# Patient Record
Sex: Male | Born: 1965 | Race: White | Hispanic: No | Marital: Single | Smoking: Current some day smoker
Health system: Southern US, Community
[De-identification: ages and names within clinical notes are randomized; demographics above are authoritative.]

## PROBLEM LIST (undated history)

## (undated) HISTORY — PX: FINGER SURGERY: SHX640

## (undated) HISTORY — PX: ANKLE FRACTURE SURGERY: SHX122

## (undated) HISTORY — PX: APPENDECTOMY: SHX54

---

## 1998-03-17 ENCOUNTER — Encounter (HOSPITAL_COMMUNITY): Admission: RE | Admit: 1998-03-17 | Discharge: 1998-06-15 | Payer: Self-pay | Admitting: Psychiatry

## 1999-01-21 ENCOUNTER — Emergency Department (HOSPITAL_COMMUNITY): Admission: EM | Admit: 1999-01-21 | Discharge: 1999-01-21 | Payer: Self-pay | Admitting: Emergency Medicine

## 2001-05-06 ENCOUNTER — Observation Stay (HOSPITAL_COMMUNITY): Admission: EM | Admit: 2001-05-06 | Discharge: 2001-05-08 | Payer: Self-pay | Admitting: *Deleted

## 2001-05-06 ENCOUNTER — Encounter: Payer: Self-pay | Admitting: *Deleted

## 2011-11-06 ENCOUNTER — Encounter: Payer: Self-pay | Admitting: *Deleted

## 2011-11-06 ENCOUNTER — Emergency Department (HOSPITAL_COMMUNITY): Payer: Self-pay

## 2011-11-06 ENCOUNTER — Emergency Department (HOSPITAL_COMMUNITY)
Admission: EM | Admit: 2011-11-06 | Discharge: 2011-11-06 | Disposition: A | Discharge status: Home / Self Care | Payer: Self-pay | Attending: Emergency Medicine | Admitting: Emergency Medicine

## 2011-11-06 DIAGNOSIS — R0781 Pleurodynia: Secondary | ICD-10-CM

## 2011-11-06 DIAGNOSIS — S298XXA Other specified injuries of thorax, initial encounter: Secondary | ICD-10-CM | POA: Insufficient documentation

## 2011-11-06 DIAGNOSIS — R079 Chest pain, unspecified: Secondary | ICD-10-CM | POA: Insufficient documentation

## 2011-11-06 MED ORDER — HYDROCODONE-ACETAMINOPHEN 5-500 MG PO TABS: 1.0000 | ORAL_TABLET | Freq: Four times a day (QID) | ORAL | Status: AC | PRN

## 2011-11-06 MED ORDER — HYDROCODONE-ACETAMINOPHEN 5-325 MG PO TABS
1.0000 | ORAL_TABLET | Freq: Once | ORAL | Status: AC
  Administered 2011-11-06: 1 via ORAL
  Filled 2011-11-06: qty 1

## 2011-11-06 MED ORDER — MORPHINE SULFATE 4 MG/ML IJ SOLN
4.0000 mg | Freq: Once | INTRAMUSCULAR | Status: AC
  Administered 2011-11-06: 4 mg via INTRAVENOUS
  Filled 2011-11-06 (×2): qty 1

## 2011-11-06 MED ORDER — MORPHINE SULFATE 4 MG/ML IJ SOLN
4.0000 mg | Freq: Once | INTRAMUSCULAR | Status: AC
  Administered 2011-11-06: 4 mg via INTRAVENOUS
  Filled 2011-11-06: qty 1
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
## 2011-11-07 NOTE — ED Provider Notes (Signed)
I saw and evaluated the patient, reviewed the resident's note and I agree with the findings and plan.   .Face to face Exam:  General:  Awake HEENT:  Atraumatic Resp:  Normal effort Abd:  Nondistended Neuro:No focal weakness Lymph: No adenopathy   Nelia Shi, MD 11/07/11 [phone]

## 2012-07-06 ENCOUNTER — Observation Stay (HOSPITAL_COMMUNITY)
Admission: EM | Admit: 2012-07-06 | Discharge: 2012-07-08 | Disposition: A | Discharge status: Home / Self Care | Payer: Self-pay | Attending: Internal Medicine | Admitting: Internal Medicine

## 2012-07-06 ENCOUNTER — Emergency Department (HOSPITAL_COMMUNITY): Payer: Self-pay

## 2012-07-06 ENCOUNTER — Encounter (HOSPITAL_COMMUNITY): Payer: Self-pay | Admitting: Emergency Medicine

## 2012-07-06 DIAGNOSIS — R918 Other nonspecific abnormal finding of lung field: Secondary | ICD-10-CM

## 2012-07-06 DIAGNOSIS — M545 Low back pain, unspecified: Secondary | ICD-10-CM | POA: Insufficient documentation

## 2012-07-06 DIAGNOSIS — R0789 Other chest pain: Secondary | ICD-10-CM | POA: Insufficient documentation

## 2012-07-06 DIAGNOSIS — S32009A Unspecified fracture of unspecified lumbar vertebra, initial encounter for closed fracture: Principal | ICD-10-CM | POA: Insufficient documentation

## 2012-07-06 DIAGNOSIS — Z720 Tobacco use: Secondary | ICD-10-CM

## 2012-07-06 DIAGNOSIS — S1093XA Contusion of unspecified part of neck, initial encounter: Secondary | ICD-10-CM | POA: Insufficient documentation

## 2012-07-06 DIAGNOSIS — IMO0002 Reserved for concepts with insufficient information to code with codable children: Secondary | ICD-10-CM

## 2012-07-06 DIAGNOSIS — G8929 Other chronic pain: Secondary | ICD-10-CM

## 2012-07-06 DIAGNOSIS — M542 Cervicalgia: Secondary | ICD-10-CM | POA: Insufficient documentation

## 2012-07-06 DIAGNOSIS — M4850XA Collapsed vertebra, not elsewhere classified, site unspecified, initial encounter for fracture: Secondary | ICD-10-CM

## 2012-07-06 DIAGNOSIS — R109 Unspecified abdominal pain: Secondary | ICD-10-CM | POA: Insufficient documentation

## 2012-07-06 DIAGNOSIS — M546 Pain in thoracic spine: Secondary | ICD-10-CM | POA: Insufficient documentation

## 2012-07-06 DIAGNOSIS — F17200 Nicotine dependence, unspecified, uncomplicated: Secondary | ICD-10-CM | POA: Insufficient documentation

## 2012-07-06 DIAGNOSIS — R51 Headache: Secondary | ICD-10-CM | POA: Insufficient documentation

## 2012-07-06 DIAGNOSIS — R52 Pain, unspecified: Secondary | ICD-10-CM

## 2012-07-06 DIAGNOSIS — S0003XA Contusion of scalp, initial encounter: Secondary | ICD-10-CM | POA: Insufficient documentation

## 2012-07-06 DIAGNOSIS — R911 Solitary pulmonary nodule: Secondary | ICD-10-CM | POA: Insufficient documentation

## 2012-07-06 LAB — URINALYSIS, ROUTINE W REFLEX MICROSCOPIC
Glucose, UA: NEGATIVE mg/dL
Protein, ur: NEGATIVE mg/dL
Specific Gravity, Urine: <1.005 — ABNORMAL LOW (ref 1.005–1.030)

## 2012-07-06 LAB — CBC WITH DIFFERENTIAL/PLATELET
Eosinophils Relative: 0 % (ref 0–5)
HCT: 44.6 % (ref 39.0–52.0)
Hemoglobin: 15.4 g/dL (ref 13.0–17.0)
Lymphocytes Relative: 10 % — ABNORMAL LOW (ref 12–46)
Lymphs Abs: 1.1 10*3/uL (ref 0.7–4.0)
MCV: 99.3 fL (ref 78.0–100.0)
Monocytes Relative: 9 % (ref 3–12)
Platelets: 180 10*3/uL (ref 150–400)
RBC: 4.49 MIL/uL (ref 4.22–5.81)
WBC: 11.1 10*3/uL — ABNORMAL HIGH (ref 4.0–10.5)

## 2012-07-06 LAB — SAMPLE TO BLOOD BANK

## 2012-07-06 LAB — BASIC METABOLIC PANEL
CO2: 28 mEq/L (ref 19–32)
Calcium: 9 mg/dL (ref 8.4–10.5)
Glucose, Bld: 103 mg/dL — ABNORMAL HIGH (ref 70–99)
Sodium: 132 mEq/L — ABNORMAL LOW (ref 135–145)

## 2012-07-06 LAB — URINE MICROSCOPIC-ADD ON

## 2012-07-06 MED ORDER — IOHEXOL 300 MG/ML  SOLN
100.0000 mL | Freq: Once | INTRAMUSCULAR | Status: AC | PRN
  Administered 2012-07-06: 100 mL via INTRAVENOUS

## 2012-07-06 MED ORDER — HYDROMORPHONE HCL PF 2 MG/ML IJ SOLN
2.0000 mg | Freq: Once | INTRAMUSCULAR | Status: AC
  Administered 2012-07-06: 2 mg via INTRAVENOUS
  Filled 2012-07-06: qty 1

## 2012-07-06 MED ORDER — SODIUM CHLORIDE 0.9 % IV SOLN
INTRAVENOUS | Status: DC
  Administered 2012-07-06: 14:00:00 via INTRAVENOUS

## 2012-07-06 MED ORDER — ONDANSETRON HCL 4 MG/2ML IJ SOLN
4.0000 mg | Freq: Once | INTRAMUSCULAR | Status: AC
  Administered 2012-07-06: 4 mg via INTRAVENOUS
  Filled 2012-07-06: qty 2

## 2012-07-06 NOTE — ED Notes (Signed)
MD at bedside. 
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
## 2012-07-06 NOTE — ED Notes (Signed)
Patient was placed in c-collar and given ice pack in Triage.
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
## 2012-07-07 ENCOUNTER — Encounter (HOSPITAL_COMMUNITY): Payer: Self-pay | Admitting: Orthopedic Surgery

## 2012-07-07 DIAGNOSIS — R52 Pain, unspecified: Secondary | ICD-10-CM | POA: Diagnosis present

## 2012-07-07 DIAGNOSIS — Z720 Tobacco use: Secondary | ICD-10-CM | POA: Diagnosis present

## 2012-07-07 DIAGNOSIS — M4850XA Collapsed vertebra, not elsewhere classified, site unspecified, initial encounter for fracture: Secondary | ICD-10-CM | POA: Diagnosis present

## 2012-07-07 DIAGNOSIS — R918 Other nonspecific abnormal finding of lung field: Secondary | ICD-10-CM | POA: Diagnosis present

## 2012-07-07 DIAGNOSIS — S32009A Unspecified fracture of unspecified lumbar vertebra, initial encounter for closed fracture: Principal | ICD-10-CM

## 2012-07-07 LAB — CBC WITH DIFFERENTIAL/PLATELET
Basophils Absolute: 0 10*3/uL (ref 0.0–0.1)
Basophils Relative: 0 % (ref 0–1)
Eosinophils Relative: 1 % (ref 0–5)
HCT: 42.7 % (ref 39.0–52.0)
MCH: 34 pg (ref 26.0–34.0)
MCHC: 34 g/dL (ref 30.0–36.0)
MCV: 100 fL (ref 78.0–100.0)
Monocytes Absolute: 0.9 10*3/uL (ref 0.1–1.0)
RDW: 13.4 % (ref 11.5–15.5)

## 2012-07-07 LAB — COMPREHENSIVE METABOLIC PANEL
AST: 21 U/L (ref 0–37)
Albumin: 3.1 g/dL — ABNORMAL LOW (ref 3.5–5.2)
Calcium: 8.4 mg/dL (ref 8.4–10.5)
Creatinine, Ser: 0.97 mg/dL (ref 0.50–1.35)
GFR calc non Af Amer: >90 mL/min (ref >90)

## 2012-07-07 LAB — CARDIAC PANEL(CRET KIN+CKTOT+MB+TROPI): Total CK: 347 U/L — ABNORMAL HIGH (ref 7–232)

## 2012-07-07 LAB — CK TOTAL AND CKMB (NOT AT ARMC)
CK, MB: 2.4 ng/mL (ref 0.3–4.0)
Relative Index: 1 (ref 0.0–2.5)
Total CK: 241 U/L — ABNORMAL HIGH (ref 7–232)

## 2012-07-07 MED ORDER — OXYCODONE-ACETAMINOPHEN 5-325 MG PO TABS
1.0000 | ORAL_TABLET | ORAL | Status: DC
  Administered 2012-07-07 – 2012-07-08 (×7): 1 via ORAL
  Filled 2012-07-07 (×7): qty 1

## 2012-07-07 MED ORDER — MORPHINE SULFATE 4 MG/ML IJ SOLN
4.0000 mg | INTRAMUSCULAR | Status: DC | PRN
  Administered 2012-07-07 – 2012-07-08 (×8): 4 mg via INTRAVENOUS
  Filled 2012-07-07 (×8): qty 1

## 2012-07-07 MED ORDER — ENOXAPARIN SODIUM 40 MG/0.4ML ~~LOC~~ SOLN
40.0000 mg | SUBCUTANEOUS | Status: DC
  Administered 2012-07-07 – 2012-07-08 (×2): 40 mg via SUBCUTANEOUS
  Filled 2012-07-07 (×2): qty 0.4

## 2012-07-07 MED ORDER — MAGNESIUM HYDROXIDE NICU ORAL SYRINGE 400 MG/5 ML: 30.0000 mL | Freq: Every day | ORAL | Status: DC

## 2012-07-07 MED ORDER — DOCUSATE SODIUM 100 MG PO CAPS
100.0000 mg | ORAL_CAPSULE | Freq: Two times a day (BID) | ORAL | Status: DC
  Administered 2012-07-07 – 2012-07-08 (×3): 100 mg via ORAL
  Filled 2012-07-07 (×3): qty 1

## 2012-07-07 MED ORDER — NICOTINE 14 MG/24HR TD PT24
14.0000 mg | MEDICATED_PATCH | Freq: Every day | TRANSDERMAL | Status: DC
  Administered 2012-07-07 – 2012-07-08 (×2): 14 mg via TRANSDERMAL
  Filled 2012-07-07 (×2): qty 1

## 2012-07-07 MED ORDER — IBUPROFEN 800 MG PO TABS
800.0000 mg | ORAL_TABLET | Freq: Three times a day (TID) | ORAL | Status: DC
  Administered 2012-07-07 – 2012-07-08 (×4): 800 mg via ORAL
  Filled 2012-07-07 (×4): qty 1

## 2012-07-07 MED ORDER — OXYCODONE-ACETAMINOPHEN 5-325 MG PO TABS
2.0000 | ORAL_TABLET | Freq: Four times a day (QID) | ORAL | Status: DC
  Administered 2012-07-07: 2 via ORAL

## 2012-07-07 MED ORDER — ONDANSETRON HCL 4 MG/2ML IJ SOLN: 4.0000 mg | Freq: Four times a day (QID) | INTRAMUSCULAR | Status: DC | PRN

## 2012-07-07 MED ORDER — DIPHENHYDRAMINE HCL 25 MG PO CAPS
25.0000 mg | ORAL_CAPSULE | Freq: Four times a day (QID) | ORAL | Status: DC | PRN
  Administered 2012-07-07 – 2012-07-08 (×3): 25 mg via ORAL
  Filled 2012-07-07 (×3): qty 1

## 2012-07-07 MED ORDER — ACETAMINOPHEN 325 MG PO TABS: 650.0000 mg | ORAL_TABLET | ORAL | Status: DC | PRN

## 2012-07-07 MED ORDER — MAGNESIUM HYDROXIDE 400 MG/5ML PO SUSP
30.0000 mL | Freq: Every day | ORAL | Status: DC
  Administered 2012-07-07 – 2012-07-08 (×2): 30 mL via ORAL
  Filled 2012-07-07 (×2): qty 30

## 2012-07-07 MED ORDER — ONDANSETRON HCL 4 MG PO TABS: 4.0000 mg | ORAL_TABLET | Freq: Four times a day (QID) | ORAL | Status: DC | PRN

## 2012-07-07 MED ORDER — MORPHINE SULFATE 2 MG/ML IJ SOLN: 2.0000 mg | INTRAMUSCULAR | Status: DC | PRN

## 2012-07-07 MED ORDER — METHOCARBAMOL 500 MG PO TABS
500.0000 mg | ORAL_TABLET | Freq: Four times a day (QID) | ORAL | Status: DC
  Administered 2012-07-07 – 2012-07-08 (×7): 500 mg via ORAL
  Filled 2012-07-07 (×6): qty 1

## 2012-07-07 MED ORDER — POTASSIUM CHLORIDE IN NACL 20-0.9 MEQ/L-% IV SOLN
INTRAVENOUS | Status: DC
  Administered 2012-07-07: 10:00:00 via INTRAVENOUS

## 2012-07-07 MED FILL — Methocarbamol Tab 500 MG: ORAL | Qty: 1 | Status: AC

## 2012-07-07 MED FILL — Nicotine TD Patch 24HR 14 MG/24HR: TRANSDERMAL | Qty: 1 | Status: AC

## 2012-07-07 MED FILL — Morphine Sulfate Inj 2 MG/ML: INTRAMUSCULAR | Qty: 1 | Status: AC

## 2012-07-07 MED FILL — Oxycodone w/ Acetaminophen Tab 5-325 MG: ORAL | Qty: 1 | Status: AC
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
## 2012-07-07 NOTE — Progress Notes (Signed)
Pt c/o "bug bites" that itch with morphine administration. Dr. Kerry Hough made aware and new order for benadryl ordered. Will administer per MD order.

## 2012-07-07 NOTE — Progress Notes (Signed)
Occupational Therapy Screen  OT order received. Chart reviewed. Spoke with PT about patient's functioning level during PT eval. Patient is ambulating independently with assistance. Went in and spoke with patient regarding OT and its role. Patient did not voice any concerns for discharge. 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 Encouraged him to look into agency's that may be able to provide some assistance. Patient educated and given handout regarding back precautions and tips and recommendations to utilize at home to prevent further pain and injury. OT will sign off.

## 2012-07-08 MED ORDER — OXYCODONE-ACETAMINOPHEN 5-325 MG PO TABS: 1.0000 | ORAL_TABLET | ORAL | Status: AC

## 2012-07-08 MED ORDER — METHOCARBAMOL 500 MG PO TABS: 500.0000 mg | ORAL_TABLET | Freq: Four times a day (QID) | ORAL | Status: AC

## 2012-07-08 MED ORDER — IBUPROFEN 800 MG PO TABS: 800.0000 mg | ORAL_TABLET | Freq: Three times a day (TID) | ORAL | Status: AC

## 2012-07-08 MED ORDER — DSS 100 MG PO CAPS: 100.0000 mg | ORAL_CAPSULE | Freq: Two times a day (BID) | ORAL | Status: AC

## 2012-07-08 NOTE — Progress Notes (Signed)
Discharge instructions reviewed with patient, patient voiced understanding. Patient given discharge instructions and prescriptions. Patient stated he was not able to get his prescriptions filled due to having no money. Asked case manager who stated there are no funds available to assist patient. Patient informed and patient stated he could afford it if it's not around $50.00. Patient in stable condition and waiting for his ride to arrive. Call light in reach. Will continue to monitor.

## 2012-07-08 NOTE — Progress Notes (Signed)
UR Chart Review Completed  
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
## 2012-07-08 NOTE — Progress Notes (Signed)
Patient in stable and transported out by this RN to ED. Patient left with Clista Bernhardt RN to get taxi ride set up.
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
## 2017-07-18 ENCOUNTER — Ambulatory Visit (INDEPENDENT_AMBULATORY_CARE_PROVIDER_SITE_OTHER): Payer: Managed Care, Other (non HMO)

## 2017-07-18 ENCOUNTER — Ambulatory Visit
Admission: EM | Admit: 2017-07-18 | Discharge: 2017-07-18 | Disposition: A | Discharge status: Home / Self Care | Payer: Managed Care, Other (non HMO) | Attending: Family Medicine | Admitting: Family Medicine

## 2017-07-18 ENCOUNTER — Encounter: Payer: Self-pay | Admitting: *Deleted

## 2017-07-18 DIAGNOSIS — S9031XA Contusion of right foot, initial encounter: Secondary | ICD-10-CM

## 2017-07-18 DIAGNOSIS — S90121A Contusion of right lesser toe(s) without damage to nail, initial encounter: Secondary | ICD-10-CM | POA: Diagnosis not present

## 2017-07-18 DIAGNOSIS — M79671 Pain in right foot: Secondary | ICD-10-CM | POA: Diagnosis not present

## 2017-07-18 MED ORDER — NAPROXEN 500 MG PO TABS: 500.0000 mg | ORAL_TABLET | Freq: Two times a day (BID) | ORAL | 0 refills | Status: DC | PRN

## 2017-07-18 NOTE — Discharge Instructions (Signed)
Recommend start Naproxen 500mg  twice a day as directed. Keep foot elevated as much as possible. Use ace wrap for support as needed. If pain and swelling do not improve within 3 to 4 days, call the Podiatrist listed for further evaluation.
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
## 2017-07-18 NOTE — ED Triage Notes (Signed)
Pt slipped and fell Sunday, struck right foot on brick wall. Now c/o right foot pain, edema and discoloration. Difficulty bearing weight.

## 2017-07-18 NOTE — ED Provider Notes (Signed)
CSN: [id number]     Arrival date & time 07/18/17  0806 History   First MD Initiated Contact with Patient 07/18/17 [phone]     Chief Complaint  Patient presents with  . Foot Pain   (Consider location/radiation/quality/duration/timing/severity/associated sxs/prior Treatment) 51 year old male presents with right foot pain and injury that occurred 4 days ago. He hit his right foot below his toes against a brick wall. Felt immediate pain but now having more pain and swelling in his toes and top of foot. Also having bruising on medial aspect of arch of foot and at base of toes. Has very limited range of motion. No pain in ankle but has history of fracture and 4 screws in ankle so normally has limited ROM. Has been applying ice and elevating foot with minimal relief. Did not work for 2 days then worked yesterday and was standing on his foot a lot which may have caused increased pain. Has also taken Ibuprofen with minimal relief. No other chronic health issues. Takes no daily medication.    The history is provided by the patient.    History reviewed. No pertinent past medical history. Past Surgical History:  Procedure Laterality Date  . ANKLE FRACTURE SURGERY     2009/OTIF  . APPENDECTOMY    . FINGER SURGERY Right    History reviewed. No pertinent family history. Social History  Substance Use Topics  . Smoking status: Former Games developermoker  . Smokeless tobacco: Current User  . Alcohol use Yes    Review of Systems  Constitutional: Negative for chills, fatigue and fever.  Respiratory: Negative for chest tightness, shortness of breath and wheezing.   Cardiovascular: Negative for chest pain and leg swelling.  Gastrointestinal: Negative for nausea and vomiting.  Musculoskeletal: Positive for arthralgias, gait problem, joint swelling and myalgias. Negative for back pain and neck pain.  Skin: Positive for color change. Negative for rash and wound.  Allergic/Immunologic: Negative for immunocompromised  state.  Neurological: Negative for dizziness, tremors, seizures, syncope, weakness, numbness and headaches.  Hematological: Negative for adenopathy. Does not bruise/bleed easily.    Allergies  Patient has no known allergies.  Home Medications   Prior to Admission medications   Medication Sig Start Date End Date Taking? Authorizing Provider  naproxen (NAPROSYN) 500 MG tablet Take 1 tablet (500 mg total) by mouth 2 (two) times daily as needed for moderate pain. 07/18/17   Sudie GrumblingAmyot, Lawson Mahone Berry, NP   Meds Ordered and Administered this Visit  Medications - No data to display  BP (!) 141/87 (BP Location: Left Arm)   Pulse 72   Temp 98.2 F (36.8 C) (Oral)   Resp 16   Ht 6\' 2"  (1.88 m)   Wt 200 lb (90.7 kg)   SpO2 99%   BMI 25.68 kg/m  No data found.   Physical Exam  Constitutional: He is oriented to person, place, and time. He appears well-developed and well-nourished. No distress.  HENT:  Head: Normocephalic and atraumatic.  Eyes: Conjunctivae and EOM are normal.  Neck: Normal range of motion.  Cardiovascular: Normal rate and regular rhythm.   Pulmonary/Chest: Effort normal and breath sounds normal. No respiratory distress.  Musculoskeletal: He exhibits edema and tenderness.       Right foot: There is decreased range of motion, tenderness, bony tenderness and swelling. There is normal capillary refill and no laceration.       Feet:  Swelling and ecchymosis present at base of all toes, especially 4th and 5th toes. Very tender  along mid-dorsal aspect of foot. Bruising present along medial plantar area of foot. Has decreased range of motion of toes and foot. Good capillary refill and pulses. No neuro deficit present.   Neurological: He is alert and oriented to person, place, and time. He has normal strength. No sensory deficit.  Skin: Skin is warm and dry. Capillary refill takes less than 2 seconds. No rash noted.  Psychiatric: He has a normal mood and affect. His behavior is normal.  Judgment and thought content normal.    Urgent Care Course     Procedures (including critical care time)  Labs Review Labs Reviewed - No data to display  Imaging Review Dg Foot Complete Right  Result Date: 07/18/2017 CLINICAL DATA:  Trauma with right foot pain. EXAM: RIGHT FOOT COMPLETE - 3+ VIEW COMPARISON:  None. FINDINGS: No acute fracture or malalignment. Joint spaces are preserved. Prior medial malleolar fracture fixation. IMPRESSION: Negative. Electronically Signed   By: Obie DredgeWilliam T Derry M.D.   On: 07/18/2017 09:09     Visual Acuity Review  Right Eye Distance:   Left Eye Distance:   Bilateral Distance:    Right Eye Near:   Left Eye Near:    Bilateral Near:         MDM   1. Acute foot pain, right   2. Contusion of right foot including toes, initial encounter    Reviewed negative x-ray results with patient. Encouraged to keep foot elevated as much as possible. Start Naproxen 500mg  twice a day as directed for pain and swelling. May use ace wrap for support. Note written for work with restrictions. Recommend follow-up with a Podiatrist in 3 to 4 days if pain and swelling do not improve.     Sudie GrumblingAmyot, Sawyer Kahan Berry, NP 07/18/17 2134

## 2017-11-29 ENCOUNTER — Other Ambulatory Visit: Payer: Self-pay | Admitting: Internal Medicine

## 2017-11-29 ENCOUNTER — Other Ambulatory Visit (HOSPITAL_COMMUNITY): Payer: Self-pay | Admitting: Occupational Therapy

## 2017-11-29 DIAGNOSIS — R10811 Right upper quadrant abdominal tenderness: Secondary | ICD-10-CM

## 2017-11-29 DIAGNOSIS — B182 Chronic viral hepatitis C: Secondary | ICD-10-CM

## 2017-12-03 ENCOUNTER — Ambulatory Visit
Admission: RE | Admit: 2017-12-03 | Discharge: 2017-12-03 | Disposition: A | Discharge status: Home / Self Care | Payer: Managed Care, Other (non HMO) | Source: Ambulatory Visit | Attending: Internal Medicine | Admitting: Internal Medicine

## 2017-12-03 DIAGNOSIS — R10811 Right upper quadrant abdominal tenderness: Secondary | ICD-10-CM | POA: Diagnosis not present

## 2017-12-03 DIAGNOSIS — K824 Cholesterolosis of gallbladder: Secondary | ICD-10-CM | POA: Diagnosis not present

## 2017-12-03 DIAGNOSIS — B182 Chronic viral hepatitis C: Secondary | ICD-10-CM | POA: Insufficient documentation

## 2018-12-05 ENCOUNTER — Encounter: Payer: Self-pay | Admitting: Physician Assistant

## 2018-12-05 ENCOUNTER — Ambulatory Visit: Payer: Self-pay | Admitting: Physician Assistant

## 2018-12-05 VITALS — BP 130/90 | HR 95 | Temp 98.5°F | Ht 74.0 in | Wt 235.0 lb

## 2018-12-05 DIAGNOSIS — J40 Bronchitis, not specified as acute or chronic: Secondary | ICD-10-CM

## 2018-12-05 DIAGNOSIS — R6889 Other general symptoms and signs: Secondary | ICD-10-CM

## 2018-12-05 LAB — POCT INFLUENZA A/B
Influenza A, POC: NEGATIVE
Influenza B, POC: NEGATIVE

## 2018-12-05 MED ORDER — ALBUTEROL SULFATE HFA 108 (90 BASE) MCG/ACT IN AERS: 2.0000 | INHALATION_SPRAY | RESPIRATORY_TRACT | 0 refills | Status: AC | PRN

## 2018-12-05 MED ORDER — PSEUDOEPH-BROMPHEN-DM 30-2-10 MG/5ML PO SYRP: 5.0000 mL | ORAL_SOLUTION | Freq: Four times a day (QID) | ORAL | 0 refills | Status: AC | PRN

## 2018-12-05 MED ORDER — PREDNISONE 50 MG PO TABS: 50.0000 mg | ORAL_TABLET | Freq: Every day | ORAL | 0 refills | Status: AC

## 2018-12-05 NOTE — Patient Instructions (Signed)
Thank you for choosing [facility] for your health care needs.  You have been diagnosed with bronchitis (a chest cold).  Recommend increase fluids; water, Gatorade, hot tea with lemon/honey, or orange juice. Rest. Go to bed early.  Use several pillows at night to prop yourself up, will help with coughing. Use cool mist humidifier in bedroom.  You have been prescribed prednisone, a steroid. Will decrease inflammation in the bronchial tubes, will help with cough and wheezing. Take 1 pill once a day x 5 days.  You have also been prescribed an inhaler; use 2 puffs every 4-6 hours for cough and wheezing.  You have also been prescribed a prescription cough syrup. Will help with cough and nasal/chest congestion.  Return to [facility] or follow-up with your family physician or with urgent care in 4-5 days if not improving. Sooner with any worsening symptoms.  Acute Bronchitis, Adult Acute bronchitis is when air tubes (bronchi) in the lungs suddenly get swollen. The condition can make it hard to breathe. It can also cause these symptoms:  A cough.  Coughing up clear, yellow, or green mucus.  Wheezing.  Chest congestion.  Shortness of breath.  A fever.  Body aches.  Chills.  A sore throat. Follow these instructions at home:  Medicines  Take over-the-counter and prescription medicines only as told by your doctor.  If you were prescribed an antibiotic medicine, take it as told by your doctor. Do not stop taking the antibiotic even if you start to feel better. General instructions  Rest.  Drink enough fluids to keep your pee (urine) pale yellow.  Avoid smoking and secondhand smoke. If you smoke and you need help quitting, ask your doctor. Quitting will help your lungs heal faster.  Use an inhaler, cool mist vaporizer, or humidifier as told by your doctor.  Keep all follow-up visits as told by your doctor. This is important. How is this prevented? To lower your risk of getting  this condition again:  Wash your hands often with soap and water. If you cannot use soap and water, use hand sanitizer.  Avoid contact with people who have cold symptoms.  Try not to touch your hands to your mouth, nose, or eyes.  Make sure to get the flu shot every year. Contact a doctor if:  Your symptoms do not get better in 2 weeks. Get help right away if:  You cough up blood.  You have chest pain.  You have very bad shortness of breath.  You become dehydrated.  You faint (pass out) or keep feeling like you are going to pass out.  You keep throwing up (vomiting).  You have a very bad headache.  Your fever or chills gets worse. This information is not intended to replace advice given to you by your health care provider. Make sure you discuss any questions you have with your health care provider. Document Released: 05/21/2008 Document Revised: 07/17/2017 Document Reviewed: 05/23/2016 Elsevier Interactive Patient Education  2019 [employer]

## 2018-12-05 NOTE — Progress Notes (Signed)
Patient ID: Carl Johnson DOB: 01/21/1966 AGE: 52 y.o. MRN: [id number]   PCP: Velia MeyerJohnson, Michelle C, PA-C   Chief Complaint:  Chief Complaint  Patient presents with  . Nasal Congestion    x 4d  . Cough    x 4d     Subjective:    HPI:  Carl Healrvin W Souder is a 52 y.o. male presents for evaluation  Chief Complaint  Patient presents with  . Nasal Congestion    x 4d  . Cough    x 354d   52 year old male [facility] to [facility] [place] with 5 day history of URI symptoms. Began with nasal congestion. Later that day developed postnasal drip, irritated throat, and cough. Cough initially dry. Then became coarse. Yesterday developed chest congestion. Cough worsening; exacerbated by cold weather, physical exertion, and lying flat at night. Associated wheezing. Patient has used Tylenol Cold & Flu Max with no symptom improvement. Patient states he suspected he had a fever last night, did not take temperature. Denies headache, dizziness/lightheadedness, ear pain, sinus pain, chest pain, SOB, nausea/vomiting, diarrhea.  Patient is a cigarette smoker. Quit for 6 years, restarted 6 months ago. Currently smokes 1 pack every 3-4 days. Chews tobacco. Averages 1 can every 2 days. No asthma, COPD or emphysema diagnosis.  Patient states his girlfriend and girlfriend's son currently ill with similar URI symptoms. Patient admits concern for influenza. Did not receive this season's influenza vaccination.  Patient regularly seen by Dr. Marcelino DusterJohn Johnston with [facility] in [place]. Treated for HTN, hyperlipidemia, and chronic hepatitis C. Currently controlled on HCTZ and Losartan. Patient's hepatitis C viral loads undetectable (12/31/2017). Last seen by PCP 06/30/2018.  A limited review of symptoms was performed, pertinent positives and negatives as mentioned in HPI.  The following portions of the patient's history were reviewed and updated as appropriate: allergies, current medications and past medical  history.  Patient Active Problem List   Diagnosis Date Noted  . Vertebral compression fracture (HCC) 07/07/2012  . Intractable pain 07/07/2012  . Tobacco abuse 07/07/2012  . Pulmonary nodules 07/07/2012    Allergies  Allergen Reactions  . Gluten Meal Swelling    Current Outpatient Medications on File Prior to Visit  Medication Sig Dispense Refill  . hydrochlorothiazide (HYDRODIURIL) 12.5 MG tablet Take by mouth.    . losartan (COZAAR) 100 MG tablet Take by mouth.     No current facility-administered medications on file prior to visit.        Objective:   Vitals:   12/05/18 0952  BP: 130/90  Pulse: 95  Temp: 98.5 F (36.9 C)  SpO2: 97%     Wt Readings from Last 3 Encounters:  12/05/18 235 lb (106.6 kg)  07/18/17 200 lb (90.7 kg)  07/06/12 190 lb (86.2 kg)    Physical Exam:   General Appearance:  Patient sitting comfortably on examination table. Conversational. Peri JeffersonGood self-historian. In no acute distress. Afebrile.   Head:  Normocephalic, without obvious abnormality, atraumatic  Eyes:  PERRL, conjunctiva/corneas clear, EOM's intact.   Ears:  Bilateral ear canals WNL. No erythema or edema. No discharge/drainage. Bilateral TMs WNL. Mild bilateral serous effusion. No erythema or injection.  Nose: Nares normal, septum midline. Nasal mucosa with bilatealr edema; junky inhalation through nose. Thick clear discharge. No sinus tenderness with percussion/palpation.  Throat: Lips, mucosa, and tongue normal; teeth and gums normal. Throat reveals faint erythema. Tonsils with no enlargement or exudate.  Neck: Supple, symmetrical, trachea midline, no adenopathy  Lungs:  Good aeration. Coarse breath sounds in bases bilaterally with expiratory wheezing. After cough, clearer lung sounds, only very faint wheezing at end of forced expiration.  Heart:  Regular rate and rhythm, S1 and S2 normal, no murmur, rub, or gallop  Extremities: Extremities normal, atraumatic, no cyanosis or edema   Pulses: 2+ and symmetric  Skin: Skin color, texture, turgor normal, no rashes or lesions  Lymph nodes: Cervical, supraclavicular, and axillary nodes normal  Neurologic: Normal    Assessment & Plan:    Exam findings, diagnosis etiology and medication use and indications reviewed with patient. Follow-Up and discharge instructions provided. No emergent/urgent issues found on exam.  Patient education was provided.   Patient verbalized understanding of information provided and agrees with plan of care (POC), all questions answered. The patient is advised to call or return to clinic if condition does not see an improvement in symptoms, or to seek the care of the closest emergency department if condition worsens with the below plan.    1. Bronchitis  - predniSONE (DELTASONE) 50 MG tablet; Take 1 tablet (50 mg total) by mouth daily with breakfast for 5 days.  Dispense: 5 tablet; Refill: 0 - albuterol (PROVENTIL HFA;VENTOLIN HFA) 108 (90 Base) MCG/ACT inhaler; Inhale 2 puffs into the lungs every 4 (four) hours as needed for wheezing or shortness of breath.  Dispense: 1 Inhaler; Refill: 0 - brompheniramine-pseudoephedrine-DM 30-2-10 MG/5ML syrup; Take 5 mLs by mouth 4 (four) times daily as needed.  Dispense: 120 mL; Refill: 0  2. Flu-like symptoms  - POCT Influenza A/B  Patient with 5 day history of URI symptoms. Coarse/junky cough with wheezing on physical examination. Negative rapid flu test. Patient with complicating factors of tobacco use and chronic hep C (undetectable viral load). VSS (afebrile, not tachycardic, not tachypnic, pulse ox 97%). Patient in no acute distress. Suspect viral bronchitis. Prescribed 5-day course of 50mg  qd prednisone, albuterol inhaler, and bromfed cough syrup. Advised increase fluids and rest. Patient advised to follow[facility] at [facility], urgent care, or with PCP in 4-5 days if not improving, sooner with any worsening symptoms.   Janalyn HarderSamantha Lawsen Arnott, MHS, PA-C Rulon SeraSamantha  F. Sindi Beckworth, MHS, PA-C Advanced Practice Provider [facility]  [facility]  [address], [facility], [address]st [address] [place], [place] [address] (p):  [phone] [email or web address]

## 2018-12-09 ENCOUNTER — Telehealth: Payer: Self-pay | Admitting: Emergency Medicine

## 2018-12-09 NOTE — Telephone Encounter (Signed)
Left message following up on visit with [facility] 

## 2020-05-17 DEATH — deceased
